# Patient Record
Sex: Male | Born: 2000 | Race: White | Hispanic: No | Marital: Single | Smoking: Current every day smoker
Health system: Southern US, Community
[De-identification: ages and names within clinical notes are randomized; demographics above are authoritative.]

---
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
## 2022-01-07 DIAGNOSIS — J069 Acute upper respiratory infection, unspecified: Secondary | ICD-10-CM | POA: Diagnosis not present

## 2022-01-07 DIAGNOSIS — J209 Acute bronchitis, unspecified: Secondary | ICD-10-CM | POA: Diagnosis not present

## 2022-01-07 DIAGNOSIS — J029 Acute pharyngitis, unspecified: Secondary | ICD-10-CM | POA: Diagnosis not present

## 2022-10-12 ENCOUNTER — Ambulatory Visit
Admission: EM | Admit: 2022-10-12 | Discharge: 2022-10-12 | Disposition: A | Discharge status: Home / Self Care | Payer: Medicaid Other | Attending: Internal Medicine | Admitting: Internal Medicine

## 2022-10-12 DIAGNOSIS — J069 Acute upper respiratory infection, unspecified: Secondary | ICD-10-CM

## 2022-10-12 DIAGNOSIS — F17290 Nicotine dependence, other tobacco product, uncomplicated: Secondary | ICD-10-CM | POA: Diagnosis not present

## 2022-10-12 MED ORDER — GUAIFENESIN ER 1200 MG PO TB12: 1200.0000 mg | ORAL_TABLET | Freq: Two times a day (BID) | ORAL | 0 refills | Status: AC

## 2022-10-12 MED ORDER — CETIRIZINE HCL 10 MG PO TABS: 10.0000 mg | ORAL_TABLET | Freq: Every day | ORAL | 0 refills | Status: AC

## 2022-10-12 MED ORDER — BENZONATATE 100 MG PO CAPS: 100.0000 mg | ORAL_CAPSULE | Freq: Three times a day (TID) | ORAL | 0 refills | Status: AC

## 2022-10-12 MED ORDER — PROMETHAZINE-DM 6.25-15 MG/5ML PO SYRP: 5.0000 mL | ORAL_SOLUTION | Freq: Every evening | ORAL | 0 refills | Status: AC | PRN

## 2022-10-12 NOTE — Discharge Instructions (Signed)
You have a viral illness which will improve on its own with rest, fluids, and medications to help with your symptoms. We discussed prescriptions that may help with your symptoms: tessalon perles, promethazine DM You may use over the counter medicines as needed: tylenol/motrin, mucinex, zyrtec, Flonase Two teaspoons of honey in 1 cup of warm water every 4-6 hours may help with throat pains. Humidifier in room at nighttime may help soothe cough (clean well daily).   For chest pain, shortness of breath, inability to keep food or fluids down without vomiting, fever that does not respond to tylenol or motrin, or any other severe symptoms, please go to the ER for further evaluation. Return to urgent care as needed, otherwise follow-up with PCP.

## 2022-10-12 NOTE — ED Provider Notes (Signed)
[facility]    CSN: [id number] Arrival date & time: 10/12/22  1218      History   Chief Complaint No chief complaint on file.   HPI David Sutton is a 22 y.o. male.   David Sutton is a 22 y.o. male presenting for chief complaint of cough, nasal congestion, body aches, fever/chills, sore throat, and generalized fatigue that started approximately 3 days ago.  Cough is dry nonproductive.  Reports runny nose and nasal congestion.  Sore throat is worsened by swallowing.  Max temp at home was 102.0 but responded well to use of Tylenol.  Currently afebrile without any antipyretic in his system.  No nausea, vomiting, dizziness, rash, shortness of breath, chest pain, heart palpitations, or history of chronic respiratory problems.  Former smoker, currently vapes with nicotine.  No other drug use.  His grandmother tested positive for COVID-19 3 days ago and he has been in direct contact with her.  Using Tylenol over-the-counter with some relief of symptoms.     History reviewed. No pertinent past medical history.  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 No pertinent surgical history.     Home Medications    Prior to Admission medications   Medication Sig Start Date End Date Taking? Authorizing Provider  Guaifenesin 1200 MG TB12 Take 1 tablet (1,200 mg total) by mouth in the morning and at bedtime. 10/12/22  Yes Carlisle Beers, FNP  promethazine-dextromethorphan (PROMETHAZINE-DM) 6.25-15 MG/5ML syrup Take 5 mLs by mouth at bedtime as needed for cough. 10/12/22  Yes Carlisle Beers, FNP   acetaminophen (TYLENOL) 500 MG tablet Take 500 mg by mouth every 6 (six) hours as needed.    [provider]  benzonatate (TESSALON) 100 MG capsule Take 1 capsule (100 mg total) by mouth every 8 (eight) hours. 10/12/22   Carlisle Beers, FNP  cephALEXin (KEFLEX) 250 MG capsule Take 1 capsule (250 mg total) by mouth 2 (two) times daily. 12/22/18   Wallis Bamberg, PA-C  cetirizine (ZYRTEC ALLERGY) 10 MG tablet Take 1 tablet (10 mg total) by mouth daily for 14 days. 10/12/22 10/26/22  Carlisle Beers, FNP  cyclobenzaprine (FLEXERIL) 5 MG tablet Take 1 tablet (5 mg total) by mouth at bedtime as needed for muscle spasms. Do not drink alcohol or drive while taking this medication.  May cause drowsiness. 05/31/21   Particia Nearing, PA-C  fluticasone [facility]) 50 MCG/ACT nasal spray Place 2 sprays into both nostrils daily. 10/07/21   Leath-Warren, Sadie Haber, NP  ketoconazole (NIZORAL) 2 % cream Apply 1 application topically daily. 10/18/18   Myrene Buddy, MD  loperamide (IMODIUM) 2 MG capsule TAKE 2 CAPSULES BY MOUTH 4 TIMES A DAY AS NEEDED FOR DIARRHEA/LOOSE STOOLS 06/14/19   Myrene Buddy, MD  naproxen (NAPROSYN) 500 MG tablet Take 1 tablet (500 mg total) by mouth 2 (two) times daily as needed. 05/31/21   Particia Nearing, PA-C    Family History Family History  Problem Relation Age of Onset   Asthma Mother    Healthy Father     Social History Social History   Tobacco Use   Smoking  status: Every Day    Types: Cigarettes   Smokeless tobacco: Never  Substance Use Topics   Alcohol use: Never   Drug use: Never     Allergies   Patient has no known allergies.   Review of Systems Review of Systems Per HPI  Physical Exam Triage Vital Signs ED Triage Vitals  Encounter Vitals Group     BP 10/12/22 1226 129/67     Systolic BP Percentile --      Diastolic BP Percentile --      Pulse Rate 10/12/22 1226 84     Resp 10/12/22 1226 16     Temp 10/12/22 1226 99 F  (37.2 C)     Temp Source 10/12/22 1226 Oral     SpO2 10/12/22 1226 98 %     Weight --      Height --      Head Circumference --      Peak Flow --      Pain Score 10/12/22 1229 2     Pain Loc --      Pain Education --      Exclude from Growth Chart --    No data found.  Updated Vital Signs BP 129/67 (BP Location: Right Arm)   Pulse 84   Temp 99 F (37.2 C) (Oral)   Resp 16   SpO2 98%   Visual Acuity Right Eye Distance:   Left Eye Distance:   Bilateral Distance:    Right Eye Near:   Left Eye Near:    Bilateral Near:     Physical Exam Vitals and nursing note reviewed.  Constitutional:      Appearance: He is ill-appearing. He is not toxic-appearing.  HENT:     Head: Normocephalic and atraumatic.     Right Ear: Hearing, tympanic membrane, ear canal and external ear normal.     Left Ear: Hearing, tympanic membrane, ear canal and external ear normal.     Nose: Congestion and rhinorrhea present.     Mouth/Throat:     Lips: Pink.     Mouth: Mucous membranes are moist. No injury.     Tongue: No lesions. Tongue does not deviate from midline.     Palate: No mass and lesions.     Pharynx: Oropharynx is clear. Uvula midline. No pharyngeal swelling, oropharyngeal exudate, posterior oropharyngeal erythema or uvula swelling.     Tonsils: No tonsillar exudate or tonsillar abscesses.  Eyes:     General: Lids are normal. Vision grossly intact. Gaze aligned appropriately.     Extraocular Movements: Extraocular movements intact.     Conjunctiva/sclera: Conjunctivae normal.  Cardiovascular:     Rate and Rhythm: Normal rate and regular rhythm.     Heart sounds: Normal heart sounds, S1 normal and S2 normal.  Pulmonary:     Effort: Pulmonary effort is normal. No respiratory distress.     Breath sounds: Normal breath sounds and air entry. No wheezing, rhonchi or rales.  Chest:     Chest wall: No tenderness.  Musculoskeletal:     Cervical back: Neck supple.  Skin:    General: Skin  is warm and dry.     Capillary Refill: Capillary refill takes less than 2 seconds.     Findings: No rash.  Neurological:     General: No focal deficit present.     Mental Status: He is alert and oriented to person, place, and time. Mental status is at baseline.     Cranial Nerves: No  dysarthria or facial asymmetry.  Psychiatric:        Mood and Affect: Mood normal.        Speech: Speech normal.        Behavior: Behavior normal.        Thought Content: Thought content normal.        Judgment: Judgment normal.      UC Treatments / Results  Labs (all labs ordered are listed, but only abnormal results are displayed) Labs Reviewed - No data to display  EKG   Radiology No results found.  Procedures Procedures (including critical care time)  Medications Ordered in UC Medications - No data to display  Initial Impression / Assessment and Plan / UC Course  I have reviewed the triage vital signs and the nursing notes.  Pertinent labs & imaging results that were available during my care of the patient were reviewed by me and considered in my medical decision making (see chart for details).   1.  Viral URI with cough, other tobacco product nicotine dependence, uncomplicated Suspect viral URI, viral syndrome. Physical exam findings reassuring, vital signs hemodynamically stable. Deferred imaging of the chest based on above findings, low suspicion for pneumonia.  Advised supportive care, offered prescriptions for symptomatic relief. Recommend continued use of OTC medications as needed, recommendations discussed with patient/caregiver and outlined in AVS.  Shared decision making used to defer viral testing today as patient is not a candidate for antiviral related to COVID-19 illness.  High clinical suspicion for COVID-19 given recent exposure to positive case.  Will provide supportive care and treat symptoms as he is not at risk for severe disease.  Counseled patient on potential for  adverse effects with medications prescribed/recommended today, strict ER and return-to-clinic precautions discussed, patient verbalized understanding.    Final Clinical Impressions(s) / UC Diagnoses   Final diagnoses:  Viral URI with cough  Other tobacco product nicotine dependence, uncomplicated     Discharge Instructions      You have a viral illness which will improve on its own with rest, fluids, and medications to help with your symptoms. We discussed prescriptions that may help with your symptoms: tessalon perles, promethazine DM You may use over the counter medicines as needed: tylenol/motrin, mucinex, zyrtec, Flonase Two teaspoons of honey in 1 cup of warm water every 4-6 hours may help with throat pains. Humidifier in room at nighttime may help soothe cough (clean well daily).   For chest pain, shortness of breath, inability to keep food or fluids down without vomiting, fever that does not respond to tylenol or motrin, or any other severe symptoms, please go to the ER for further evaluation. Return to urgent care as needed, otherwise follow-up with PCP.      ED Prescriptions     Medication Sig Dispense Auth. Provider   benzonatate (TESSALON) 100 MG capsule Take 1 capsule (100 mg total) by mouth every 8 (eight) hours. 21 capsule Carlisle Beers, FNP   cetirizine (ZYRTEC ALLERGY) 10 MG tablet Take 1 tablet (10 mg total) by mouth daily for 14 days. 14 tablet Carlisle Beers, FNP   promethazine-dextromethorphan (PROMETHAZINE-DM) 6.25-15 MG/5ML syrup Take 5 mLs by mouth at bedtime as needed for cough. 118 mL Reita May M, FNP   Guaifenesin 1200 MG TB12 Take 1 tablet (1,200 mg total) by mouth in the morning and at bedtime. 14 tablet Carlisle Beers, FNP      PDMP not reviewed this encounter.   Carlisle Beers, FNP  10/12/22 1311  

## 2022-10-12 NOTE — ED Triage Notes (Signed)
Pt reports he has a fever, nasal congestion, sob, bad cough, constant headache , and body aches x 3 days.   Took tylenol but no relief

## 2023-07-28 ENCOUNTER — Emergency Department (HOSPITAL_COMMUNITY)
Admission: EM | Admit: 2023-07-28 | Discharge: 2023-07-29 | Disposition: A | Discharge status: Home / Self Care | Payer: Worker's Compensation | Attending: Emergency Medicine | Admitting: Emergency Medicine

## 2023-07-28 ENCOUNTER — Encounter (HOSPITAL_COMMUNITY): Payer: Self-pay | Admitting: Emergency Medicine

## 2023-07-28 ENCOUNTER — Other Ambulatory Visit: Payer: Self-pay

## 2023-07-28 ENCOUNTER — Emergency Department (HOSPITAL_COMMUNITY): Payer: Worker's Compensation

## 2023-07-28 DIAGNOSIS — Y990 Civilian activity done for income or pay: Secondary | ICD-10-CM | POA: Insufficient documentation

## 2023-07-28 DIAGNOSIS — S91202A Unspecified open wound of left great toe with damage to nail, initial encounter: Secondary | ICD-10-CM | POA: Diagnosis not present

## 2023-07-28 DIAGNOSIS — Z23 Encounter for immunization: Secondary | ICD-10-CM | POA: Insufficient documentation

## 2023-07-28 DIAGNOSIS — S99922A Unspecified injury of left foot, initial encounter: Secondary | ICD-10-CM | POA: Diagnosis present

## 2023-07-28 DIAGNOSIS — S90212A Contusion of left great toe with damage to nail, initial encounter: Secondary | ICD-10-CM

## 2023-07-28 DIAGNOSIS — W208XXA Other cause of strike by thrown, projected or falling object, initial encounter: Secondary | ICD-10-CM | POA: Insufficient documentation

## 2023-07-28 DIAGNOSIS — M7989 Other specified soft tissue disorders: Secondary | ICD-10-CM | POA: Diagnosis not present

## 2023-07-28 DIAGNOSIS — S91209A Unspecified open wound of unspecified toe(s) with damage to nail, initial encounter: Secondary | ICD-10-CM

## 2023-07-28 MED ORDER — IBUPROFEN 400 MG PO TABS
ORAL_TABLET | ORAL | Status: AC
  Filled 2023-07-28: qty 1

## 2023-07-28 MED ORDER — IBUPROFEN 400 MG PO TABS
400.0000 mg | ORAL_TABLET | Freq: Once | ORAL | Status: AC | PRN
  Administered 2023-07-28: 400 mg via ORAL

## 2023-07-28 MED ORDER — LIDOCAINE HCL (PF) 1 % IJ SOLN
5.0000 mL | Freq: Once | INTRAMUSCULAR | Status: DC
  Filled 2023-07-28: qty 5

## 2023-07-28 MED ORDER — TETANUS-DIPHTH-ACELL PERTUSSIS 5-2.5-18.5 LF-MCG/0.5 IM SUSY
0.5000 mL | PREFILLED_SYRINGE | Freq: Once | INTRAMUSCULAR | Status: AC
  Administered 2023-07-28: 0.5 mL via INTRAMUSCULAR
  Filled 2023-07-28: qty 0.5

## 2023-07-28 MED ORDER — LIDOCAINE-EPINEPHRINE-TETRACAINE (LET) TOPICAL GEL
3.0000 mL | Freq: Once | TOPICAL | Status: AC
  Administered 2023-07-28: 3 mL via TOPICAL
  Filled 2023-07-28: qty 3

## 2023-07-28 NOTE — ED Provider Triage Note (Signed)
 Emergency Medicine Provider Triage Evaluation Note  [facility] , a 23 y.o. male  was evaluated in triage.  Pt complains of LEFT foot injury after running his foot over at work. Pain with movement, no MAT. Last tetanus unknown   Review of Systems  Positive: Foot pain,wound Negative: numbness  Physical Exam  Ht 5' 10 (1.778 m)   Wt 61.2 kg   BMI 19.37 kg/m  Gen:   Awake, no distress   Resp:  Normal effort  MSK:   Moves extremities without difficulty  Other:  Missing toe nail, pulses present, pain wit movement,   Medical Decision Making  Medically screening exam initiated at 7:28 PM.  Appropriate orders placed.  [place] was informed that the remainder of the evaluation will be completed by another provider, this initial triage assessment does not replace that evaluation, and the importance of remaining in the ED until their evaluation is complete.     Simrit Gohlke, PA-C 07/28/23 1932

## 2023-07-28 NOTE — ED Provider Notes (Signed)
 [facility] Provider Note   CSN: [id number] Arrival date & time: 07/28/23  1919     Patient presents with: Foot Injury   [name] is a 23 y.o. male.  Patient presents from work with concern for left foot injury.  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  His great toenail was pulled off and he had bleeding from his big toe.  He came immediately to the ED.  He has been unable to bear weight.  No medicines prior to arrival.  He is otherwise healthy.  Unknown when his last tetanus shot was.  No allergies.    Foot Injury      Prior to Admission medications   Medication Sig Start Date End Date Taking? Authorizing Provider  acetaminophen (TYLENOL) 500 MG tablet Take 500 mg by mouth every 6 (six) hours as needed.    [provider]  benzonatate  (TESSALON ) 100 MG capsule Take 1 capsule (100 mg total) by mouth every 8 (eight) hours. 10/12/22   Enedelia Dorna HERO, FNP  cephALEXin  (KEFLEX ) 250 MG capsule Take 1 capsule (250 mg total) by mouth 2 (two) times daily. 12/22/18   Christopher Savannah, PA-C  cetirizine  (ZYRTEC  ALLERGY) 10 MG tablet Take 1 tablet (10 mg total) by mouth daily for 14 days. 10/12/22 10/26/22  Enedelia Dorna HERO, FNP  cyclobenzaprine  (FLEXERIL ) 5 MG tablet Take 1 tablet (5 mg total) by mouth at bedtime as needed for muscle spasms. Do not drink alcohol or drive while taking this medication.  May cause drowsiness. 05/31/21   Stuart Vernell Norris, PA-C  fluticasone  (FLONASE ) 50 MCG/ACT nasal spray Place 2 sprays into both nostrils daily. 10/07/21   Leath-Warren, Etta PARAS, NP  Guaifenesin  1200 MG TB12 Take 1 tablet (1,200 mg total) by mouth in the morning and at bedtime. 10/12/22   Enedelia Dorna HERO, FNP  ketoconazole  (NIZORAL ) 2 % cream Apply 1 application topically daily. 10/18/18   Genette Cadet, MD  loperamide  (IMODIUM ) 2 MG capsule TAKE 2 CAPSULES BY MOUTH 4 TIMES A DAY AS NEEDED  FOR DIARRHEA/LOOSE STOOLS 06/14/19   Fletcher, Jacob, MD  naproxen  (NAPROSYN ) 500 MG tablet Take 1 tablet (500 mg total) by mouth 2 (two) times daily as needed. 05/31/21   Stuart Vernell Norris, PA-C  promethazine -dextromethorphan (PROMETHAZINE -DM) 6.25-15 MG/5ML syrup Take 5 mLs by mouth at bedtime as needed for cough. 10/12/22   Enedelia Dorna HERO, FNP    Allergies: Patient has no known allergies.    Review of Systems  Skin:  Positive for wound.  All other systems reviewed and are negative.   Updated Vital Signs BP 116/76 (BP Location: Left Arm)   Pulse 78   Temp 97.6 F (36.4 C) (Oral)   Resp 20   Ht 5' 10 (1.778 m)   Wt 60 kg   SpO2 100%   BMI 18.98 kg/m   Physical Exam Vitals and nursing note reviewed.  Constitutional:      General: He is not in acute distress.    Appearance: Normal appearance. He is well-developed and normal weight. He is not ill-appearing, toxic-appearing or diaphoretic.  HENT:     Head: Normocephalic and atraumatic.     Right Ear: External ear normal.     Left Ear: External ear normal.     Nose: Nose normal.     Mouth/Throat:     Mouth: Mucous membranes are moist.     Pharynx: Oropharynx is  clear.  Eyes:     Extraocular Movements: Extraocular movements intact.     Conjunctiva/sclera: Conjunctivae normal.     Pupils: Pupils are equal, round, and reactive to light.  Cardiovascular:     Rate and Rhythm: Normal rate and regular rhythm.     Pulses: Normal pulses.     Heart sounds: Normal heart sounds. No murmur heard. Pulmonary:     Effort: Pulmonary effort is normal. No respiratory distress.     Breath sounds: Normal breath sounds.  Abdominal:     General: Abdomen is flat. There is no distension.     Palpations: Abdomen is soft.     Tenderness: There is no abdominal tenderness.  Musculoskeletal:        General: Tenderness present. No swelling.     Cervical back: Normal range of motion and neck supple.     Comments: Avulsion injury of left  great toe nail. Nail completely removed. Dried blood to nail bed. Swelling to great toe with tenderness, but normal ROM. Ttp and swelling to distal 2nd toe, with o/w normal ROM. Nail of 2nd toe intact.   Skin:    General: Skin is warm and dry.     Capillary Refill: Capillary refill takes less than 2 seconds.     Coloration: Skin is not jaundiced.  Neurological:     General: No focal deficit present.     Mental Status: He is alert and oriented to person, place, and time. Mental status is at baseline.  Psychiatric:        Mood and Affect: Mood normal.     (all labs ordered are listed, but only abnormal results are displayed) Labs Reviewed - No data to display  EKG: None  Radiology: DG Foot Complete Left Result Date: 07/28/2023 CLINICAL DATA:  Ran over foot with [employer]let [place] with nail bed injury EXAM: LEFT FOOT - COMPLETE 3+ VIEW COMPARISON:  None Available. FINDINGS: No acute fracture or dislocation is noted. The first toenail is missing consistent with the given clinical history. IMPRESSION: Soft tissue injury consistent with the given clinical history. No acute bony abnormality is noted. Electronically Signed   By: Oneil Devonshire M.D.   On: 07/28/2023 19:51     Procedures   Medications Ordered in the ED  ibuprofen  (ADVIL ) 400 MG tablet (has no administration in time range)  lidocaine  (PF) (XYLOCAINE ) 1 % injection 5 mL (has no administration in time range)  Tdap (BOOSTRIX ) injection 0.5 mL (0.5 mLs Intramuscular Given 07/28/23 2340)  ibuprofen  (ADVIL ) tablet 400 mg (400 mg Oral Given 07/28/23 2204)  lidocaine -EPINEPHrine -tetracaine  (LET) topical gel (3 mLs Topical Given 07/28/23 2351)                                    Medical Decision Making Amount and/or Complexity of Data Reviewed Independent Historian: parent Radiology: ordered and independent interpretation performed. Decision-making details documented in ED Course.  Risk OTC drugs. Prescription drug  management.   23 year old otherwise healthy male presenting with concern for left foot/toe injury at work.  Here in the ED he is afebrile with normal vitals.  Neurovascularly intact on exam.  Full avulsion injury of his left great toenail with minor soft tissue crush injury to the underlying nailbed.  Questionable very small hemostatic nailbed laceration.  Otherwise normal range of motion of toes and foot.  Differential includes contusion, hematoma, sprain/strain or underlying toe/foot fracture.  X-rays obtained,  visualized by me negative for osseous injury or radiopaque foreign body.  Wound cleanse and dressed with nonadherent dressing.  No significant wound requiring primary closure.  Patient given crutches for weightbearing activity as tolerated.  Discussed local wound care and recommended primary care follow-up as needed.  Return precautions discussed and all questions were answered.  Patient is comfortable with this plan.  This dictation was prepared using Air traffic controller. As a result, errors may occur.       Final diagnoses:  Contusion of left great toe with damage to nail, initial encounter  Avulsion of toenail, initial encounter    ED Discharge Orders     None          Anne Elsie LABOR, MD 07/29/23 [phone]

## 2023-07-28 NOTE — ED Notes (Signed)
 Pt c/o increasing pain, pt given PRN medication and toe covered with wet to dry dressing

## 2023-07-28 NOTE — ED Triage Notes (Addendum)
 Patient reports running left foot over with pallet jack. Pain to left foot and missing toe nail on big toe.

## 2023-07-29 NOTE — Progress Notes (Signed)
 Orthopedic Tech Progress Note Patient Details:  [name] 20-Oct-2000 [id number]  Ortho Devices Type of Ortho Device: Crutches Ortho Device/Splint Location: LE Ortho Device/Splint Interventions: Ordered, Application, Adjustment   Post Interventions Patient Tolerated: Well Instructions Provided: Care of device   Mialynn Shelvin L Meliana Canner 07/29/2023, 12:06 AM
